# Patient Record
Sex: Male | Born: 1945
Health system: Southern US, Community
[De-identification: ages and names within clinical notes are randomized; demographics above are authoritative.]

## PROBLEM LIST (undated history)

## (undated) DIAGNOSIS — I10 Essential (primary) hypertension: Secondary | ICD-10-CM

## (undated) DIAGNOSIS — C801 Malignant (primary) neoplasm, unspecified: Secondary | ICD-10-CM

## (undated) DIAGNOSIS — E7800 Pure hypercholesterolemia, unspecified: Secondary | ICD-10-CM

## (undated) HISTORY — PX: PROSTATECTOMY: SHX69

## (undated) HISTORY — PX: OTHER SURGICAL HISTORY: SHX169

---

## 2017-10-16 DIAGNOSIS — I10 Essential (primary) hypertension: Secondary | ICD-10-CM | POA: Diagnosis not present

## 2017-10-16 DIAGNOSIS — R7301 Impaired fasting glucose: Secondary | ICD-10-CM | POA: Diagnosis not present

## 2017-10-16 DIAGNOSIS — R5381 Other malaise: Secondary | ICD-10-CM | POA: Diagnosis not present

## 2017-10-21 DIAGNOSIS — R7301 Impaired fasting glucose: Secondary | ICD-10-CM | POA: Diagnosis not present

## 2017-11-18 DIAGNOSIS — I10 Essential (primary) hypertension: Secondary | ICD-10-CM | POA: Diagnosis not present

## 2017-12-16 DIAGNOSIS — S0101XA Laceration without foreign body of scalp, initial encounter: Secondary | ICD-10-CM | POA: Diagnosis not present

## 2017-12-16 DIAGNOSIS — R030 Elevated blood-pressure reading, without diagnosis of hypertension: Secondary | ICD-10-CM | POA: Diagnosis not present

## 2017-12-16 DIAGNOSIS — S0003XD Contusion of scalp, subsequent encounter: Secondary | ICD-10-CM | POA: Diagnosis not present

## 2017-12-16 DIAGNOSIS — Z87891 Personal history of nicotine dependence: Secondary | ICD-10-CM | POA: Diagnosis not present

## 2017-12-16 DIAGNOSIS — W010XXA Fall on same level from slipping, tripping and stumbling without subsequent striking against object, initial encounter: Secondary | ICD-10-CM | POA: Diagnosis not present

## 2017-12-16 DIAGNOSIS — S0191XA Laceration without foreign body of unspecified part of head, initial encounter: Secondary | ICD-10-CM | POA: Diagnosis not present

## 2017-12-16 DIAGNOSIS — S0990XA Unspecified injury of head, initial encounter: Secondary | ICD-10-CM | POA: Diagnosis not present

## 2017-12-16 DIAGNOSIS — S01311A Laceration without foreign body of right ear, initial encounter: Secondary | ICD-10-CM | POA: Diagnosis not present

## 2017-12-16 DIAGNOSIS — S0181XA Laceration without foreign body of other part of head, initial encounter: Secondary | ICD-10-CM | POA: Diagnosis not present

## 2017-12-16 DIAGNOSIS — Z23 Encounter for immunization: Secondary | ICD-10-CM | POA: Diagnosis not present

## 2018-04-23 DIAGNOSIS — I10 Essential (primary) hypertension: Secondary | ICD-10-CM | POA: Diagnosis not present

## 2018-04-23 DIAGNOSIS — E782 Mixed hyperlipidemia: Secondary | ICD-10-CM | POA: Diagnosis not present

## 2018-04-23 DIAGNOSIS — E559 Vitamin D deficiency, unspecified: Secondary | ICD-10-CM | POA: Diagnosis not present

## 2018-04-23 DIAGNOSIS — R7301 Impaired fasting glucose: Secondary | ICD-10-CM | POA: Diagnosis not present

## 2018-04-28 DIAGNOSIS — E782 Mixed hyperlipidemia: Secondary | ICD-10-CM | POA: Diagnosis not present

## 2018-04-28 DIAGNOSIS — R7301 Impaired fasting glucose: Secondary | ICD-10-CM | POA: Diagnosis not present

## 2018-04-28 DIAGNOSIS — I10 Essential (primary) hypertension: Secondary | ICD-10-CM | POA: Diagnosis not present

## 2018-04-28 DIAGNOSIS — E559 Vitamin D deficiency, unspecified: Secondary | ICD-10-CM | POA: Diagnosis not present

## 2018-07-16 ENCOUNTER — Ambulatory Visit (INDEPENDENT_AMBULATORY_CARE_PROVIDER_SITE_OTHER): Payer: Self-pay

## 2018-07-16 DIAGNOSIS — Z1211 Encounter for screening for malignant neoplasm of colon: Secondary | ICD-10-CM

## 2018-07-16 MED ORDER — NA SULFATE-K SULFATE-MG SULF 17.5-3.13-1.6 GM/177ML PO SOLN: 1.0000 | ORAL | 0 refills | Status: AC

## 2018-07-16 NOTE — Patient Instructions (Signed)
Gregg Dalton  [birth year] MRN: [id number]     Procedure Date: 08/25/18 Time to register: 9:30am Place to register: [facility] Procedure Time: 10:30am Scheduled provider: R. Garfield Cornea, MD    PREPARATION FOR COLONOSCOPY WITH SUPREP BOWEL PREP KIT  Note: Suprep Bowel Prep Kit is a split-dose (2day) regimen. Consumption of BOTH 6-ounce bottles is required for a complete prep.  Please notify us immediately if you are diabetic, take iron supplements, or if you are on Coumadin or any other blood thinners.                                                                                                                                                    2 DAYS BEFORE PROCEDURE:  DATE: 08/23/18   DAY: Sunday Begin clear liquid diet AFTER your lunch meal. NO SOLID FOODS after this point.  1 DAY BEFORE PROCEDURE:  DATE: 08/24/18   DAY: Monday Continue clear liquids the entire day - NO SOLID FOOD.    At 6:00pm: Complete steps 1 through 4 below, using ONE (1) 6-ounce bottle, before going to bed. Step 1:  Pour ONE (1) 6-ounce bottle of SUPREP liquid into the mixing container.  Step 2:  Add cool drinking water to the 16 ounce line on the container and mix.  Note: Dilute the solution concentrate as directed prior to use. Step 3:  DRINK ALL the liquid in the container. Step 4:  You MUST drink an additional two (2) or more 16 ounce containers of water over the next one (1) hour.   Continue clear liquids.  DAY OF PROCEDURE:   DATE: 08/25/18   DAY: Tuesday If you take medications for your heart, blood pressure, or breathing, you may take these medications.    5 hours before your procedure at:5:30am Step 1:  Pour ONE (1) 6-ounce bottle of SUPREP liquid into the mixing container.  Step 2:  Add cool drinking water to the 16 ounce line on the container and mix.  Note: Dilute the solution concentrate as directed prior to use. Step 3:  DRINK ALL the liquid in the container. Step 4:  You  MUST drink an additional two (2) or more 16 ounce containers of water over the next one (1) hour. You MUST complete the final glass of water at least 3 hours before your colonoscopy.   Nothing by mouth past:7:30am  You may take your morning medications with sip of water unless we have instructed otherwise.    Please see below for Dietary Information.  CLEAR LIQUIDS INCLUDE:  Water Jello (NOT red in color)   Ice Popsicles (NOT red in color)   Tea (sugar ok, no milk/cream) Powdered fruit flavored drinks  Coffee (sugar ok, no milk/cream) Gatorade/ Lemonade/ Kool-Aid  (NOT red in color)   Juice: apple, white grape, white cranberry Soft  drinks  Clear bullion, consomme, broth (fat free beef/chicken/vegetable)  Carbonated beverages (any kind)  Strained chicken noodle soup Hard Candy   Remember: Clear liquids are liquids that will allow you to see your fingers on the other side of a clear glass. Be sure liquids are NOT red in color, and not cloudy, but CLEAR.  DO NOT EAT OR DRINK ANY OF THE FOLLOWING:  Dairy products of any kind   Cranberry juice Tomato juice / V8 juice   Grapefruit juice Orange juice     Red grape juice  Do not eat any solid foods, including such foods as: cereal, oatmeal, yogurt, fruits, vegetables, creamed soups, eggs, bread, crackers, pureed foods in a blender, etc.   HELPFUL HINTS FOR DRINKING PREP SOLUTION:   Make sure prep is extremely cold. Mix and refrigerate the the morning of the prep. You may also put in the freezer.   You may try mixing some Crystal Light or Country Time Lemonade if you prefer. Mix in small amounts; add more if necessary.  Try drinking through a straw  Rinse mouth with water or a mouthwash between glasses, to remove after-taste.  Try sipping on a cold beverage /ice/ popsicles between glasses of prep.  Place a piece of sugar-free hard candy in mouth between glasses.  If you become nauseated, try consuming smaller amounts, or stretch out  the time between glasses. Stop for 30-60 minutes, then slowly start back drinking.     OTHER INSTRUCTIONS  You will need a responsible adult at least 72 years of age to accompany you and drive you home. This person must remain in the waiting room during your procedure. The hospital will cancel your procedure if you do not have a responsible adult with you.   1. Wear loose fitting clothing that is easily removed. 2. Leave jewelry and other valuables at home.  3. Remove all body piercing jewelry and leave at home. 4. Total time from sign-in until discharge is approximately 2-3 hours. 5. You should go home directly after your procedure and rest. You can resume normal activities the day after your procedure. 6. The day of your procedure you should not:  Drive  Make legal decisions  Operate machinery  Drink alcohol  Return to work   You may call the office (Dept: [phone]) before 5:00pm, or page the doctor on call [phone]) after 5:00pm, for further instructions, if necessary.   Insurance Information YOU WILL NEED TO CHECK WITH YOUR INSURANCE COMPANY FOR THE BENEFITS OF COVERAGE YOU HAVE FOR THIS PROCEDURE.  UNFORTUNATELY, NOT ALL INSURANCE COMPANIES HAVE BENEFITS TO COVER ALL OR PART OF THESE TYPES OF PROCEDURES.  IT IS YOUR RESPONSIBILITY TO CHECK YOUR BENEFITS, HOWEVER, WE WILL BE GLAD TO ASSIST YOU WITH ANY CODES YOUR INSURANCE COMPANY MAY NEED.    PLEASE NOTE THAT MOST INSURANCE COMPANIES WILL NOT COVER A SCREENING COLONOSCOPY FOR PEOPLE UNDER THE AGE OF 50  IF YOU HAVE BCBS INSURANCE, YOU MAY HAVE BENEFITS FOR A SCREENING COLONOSCOPY BUT IF POLYPS ARE FOUND THE DIAGNOSIS WILL CHANGE AND THEN YOU MAY HAVE A DEDUCTIBLE THAT WILL NEED TO BE MET. SO PLEASE MAKE SURE YOU CHECK YOUR BENEFITS FOR A SCREENING COLONOSCOPY AS WELL AS A DIAGNOSTIC COLONOSCOPY.

## 2018-07-16 NOTE — Progress Notes (Signed)
Gastroenterology Pre-Procedure Review  Request Date:07/16/18 Requesting Physician: Inetta Fermo ( previous tcs in 2009 or 2010 at [place] in [place]. Pt reports no polyps. Manuela Schwartz to get copy of tcs)  PATIENT REVIEW QUESTIONS: The patient responded to the following health history questions as indicated:    1. Diabetes Melitis: no 2. Joint replacements in the past 12 months: no 3. Major health problems in the past 3 months: no 4. Has an artificial valve or MVP: no 5. Has a defibrillator: no 6. Has been advised in past to take antibiotics in advance of a procedure like teeth cleaning: no 7. Family history of colon cancer: no  8. Alcohol Use: yes (wine with dinner ) 9. History of sleep apnea: no  10. History of coronary artery or other vascular stents placed within the last 12 months: no 11. History of any prior anesthesia complications: no    MEDICATIONS & ALLERGIES:    Patient reports the following regarding taking any blood thinners:   Plavix? no Aspirin? yes (81mg ) Coumadin? no Brilinta? no Xarelto? no Eliquis? no Pradaxa? no Savaysa? no Effient? no  Patient confirms/reports the following medications:  Current Outpatient Medications  Medication Sig Dispense Refill  . aspirin EC 81 MG tablet Take 81 mg by mouth daily.    Marland Kitchen atorvastatin (LIPITOR) 10 MG tablet daily.    . cholecalciferol (VITAMIN D) 1000 units tablet Take 1,000 Units by mouth daily.    Marland Kitchen losartan (COZAAR) 100 MG tablet daily.     No current facility-administered medications for this visit.     Patient confirms/reports the following allergies:  No Known Allergies  No orders of the defined types were placed in this encounter.   AUTHORIZATION INFORMATION Primary Insurance:[employer] medicare ,  ID #: [id number] Pre-Cert / Josem Kaufmann required: no  SCHEDULE INFORMATION: Procedure has been scheduled as follows:  Date: 08/25/18, Time: 10:30  Location: [facility]  This Gastroenterology Pre-Precedure Review Form is  being routed to the following provider(s): Neil Crouch, PA

## 2018-07-22 NOTE — Progress Notes (Signed)
Ok to schedule.

## 2018-08-25 ENCOUNTER — Other Ambulatory Visit: Payer: Self-pay

## 2018-08-25 ENCOUNTER — Encounter (HOSPITAL_COMMUNITY)
Admission: RE | Disposition: A | Discharge status: Home / Self Care | Payer: Self-pay | Source: Ambulatory Visit | Attending: Internal Medicine

## 2018-08-25 ENCOUNTER — Encounter (HOSPITAL_COMMUNITY): Payer: Self-pay | Admitting: *Deleted

## 2018-08-25 ENCOUNTER — Ambulatory Visit (HOSPITAL_COMMUNITY)
Admission: RE | Admit: 2018-08-25 | Discharge: 2018-08-25 | Disposition: A | Discharge status: Home / Self Care | Payer: Medicare Other | Source: Ambulatory Visit | Attending: Internal Medicine | Admitting: Internal Medicine

## 2018-08-25 DIAGNOSIS — Z1211 Encounter for screening for malignant neoplasm of colon: Secondary | ICD-10-CM | POA: Insufficient documentation

## 2018-08-25 DIAGNOSIS — E7800 Pure hypercholesterolemia, unspecified: Secondary | ICD-10-CM | POA: Insufficient documentation

## 2018-08-25 DIAGNOSIS — I10 Essential (primary) hypertension: Secondary | ICD-10-CM | POA: Insufficient documentation

## 2018-08-25 DIAGNOSIS — K5730 Diverticulosis of large intestine without perforation or abscess without bleeding: Secondary | ICD-10-CM | POA: Insufficient documentation

## 2018-08-25 DIAGNOSIS — Z7982 Long term (current) use of aspirin: Secondary | ICD-10-CM | POA: Diagnosis not present

## 2018-08-25 HISTORY — DX: Malignant (primary) neoplasm, unspecified: C80.1

## 2018-08-25 HISTORY — PX: COLONOSCOPY: SHX5424

## 2018-08-25 HISTORY — DX: Essential (primary) hypertension: I10

## 2018-08-25 HISTORY — DX: Pure hypercholesterolemia, unspecified: E78.00

## 2018-08-25 SURGERY — COLONOSCOPY
Anesthesia: Moderate Sedation

## 2018-08-25 MED ORDER — ONDANSETRON HCL 4 MG/2ML IJ SOLN
INTRAMUSCULAR | Status: AC
  Filled 2018-08-25: qty 2

## 2018-08-25 MED ORDER — MIDAZOLAM HCL 5 MG/5ML IJ SOLN
INTRAMUSCULAR | Status: AC
  Filled 2018-08-25: qty 5

## 2018-08-25 MED ORDER — MEPERIDINE HCL 100 MG/ML IJ SOLN
INTRAMUSCULAR | Status: DC | PRN
  Administered 2018-08-25: 25 mg
  Administered 2018-08-25: 15 mg

## 2018-08-25 MED ORDER — SODIUM CHLORIDE 0.9 % IV SOLN
INTRAVENOUS | Status: DC
  Administered 2018-08-25: 10:00:00 via INTRAVENOUS

## 2018-08-25 MED ORDER — ONDANSETRON HCL 4 MG/2ML IJ SOLN
INTRAMUSCULAR | Status: DC | PRN
  Administered 2018-08-25: 4 mg via INTRAVENOUS

## 2018-08-25 MED ORDER — MEPERIDINE HCL 50 MG/ML IJ SOLN
INTRAMUSCULAR | Status: AC
  Filled 2018-08-25: qty 1

## 2018-08-25 MED ORDER — MIDAZOLAM HCL 5 MG/5ML IJ SOLN
INTRAMUSCULAR | Status: DC | PRN
  Administered 2018-08-25: 1 mg via INTRAVENOUS
  Administered 2018-08-25 (×2): 2 mg via INTRAVENOUS
  Administered 2018-08-25: 1 mg via INTRAVENOUS

## 2018-08-25 MED ORDER — STERILE WATER FOR IRRIGATION IR SOLN
Status: DC | PRN
  Administered 2018-08-25: 10:00:00

## 2018-08-25 NOTE — Discharge Instructions (Signed)
Colonoscopy Discharge Instructions  Read the instructions outlined below and refer to this sheet in the next few weeks. These discharge instructions provide you with general information on caring for yourself after you leave the hospital. Your doctor may also give you specific instructions. While your treatment has been planned according to the most current medical practices available, unavoidable complications occasionally occur. If you have any problems or questions after discharge, call Dr. Gala Romney at [phone]. ACTIVITY  You may resume your regular activity, but move at a slower pace for the next 24 hours.   Take frequent rest periods for the next 24 hours.   Walking will help get rid of the air and reduce the bloated feeling in your belly (abdomen).   No driving for 24 hours (because of the medicine (anesthesia) used during the test).    Do not sign any important legal documents or operate any machinery for 24 hours (because of the anesthesia used during the test).  NUTRITION  Drink plenty of fluids.   You may resume your normal diet as instructed by your doctor.   Begin with a light meal and progress to your normal diet. Heavy or fried foods are harder to digest and may make you feel sick to your stomach (nauseated).   Avoid alcoholic beverages for 24 hours or as instructed.  MEDICATIONS  You may resume your normal medications unless your doctor tells you otherwise.  WHAT YOU CAN EXPECT TODAY  Some feelings of bloating in the abdomen.   Passage of more gas than usual.   Spotting of blood in your stool or on the toilet paper.  IF YOU HAD POLYPS REMOVED DURING THE COLONOSCOPY:  No aspirin products for 7 days or as instructed.   No alcohol for 7 days or as instructed.   Eat a soft diet for the next 24 hours.  FINDING OUT THE RESULTS OF YOUR TEST Not all test results are available during your visit. If your test results are not back during the visit, make an appointment  with your caregiver to find out the results. Do not assume everything is normal if you have not heard from your caregiver or the medical facility. It is important for you to follow up on all of your test results.  SEEK IMMEDIATE MEDICAL ATTENTION IF:  You have more than a spotting of blood in your stool.   Your belly is swollen (abdominal distention).   You are nauseated or vomiting.   You have a temperature over 101.   You have abdominal pain or discomfort that is severe or gets worse throughout the day.    Colon diverticulosis information provided  I do not recommend a future colonoscopy unless new symptoms develop    Diverticulosis Diverticulosis is a condition that develops when small pouches (diverticula) form in the wall of the large intestine (colon). The colon is where water is absorbed and stool is formed. The pouches form when the inside layer of the colon pushes through weak spots in the outer layers of the colon. You may have a few pouches or many of them. What are the causes? The cause of this condition is not known. What increases the risk? The following factors may make you more likely to develop this condition:  Being older than age 66. Your risk for this condition increases with age. Diverticulosis is rare among people younger than age 8. By age 61, many people have it.  Eating a low-fiber diet.  Having frequent constipation.  Being  overweight.  Not getting enough exercise.  Smoking.  Taking over-the-counter pain medicines, like aspirin and ibuprofen.  Having a family history of diverticulosis.  What are the signs or symptoms? In most people, there are no symptoms of this condition. If you do have symptoms, they may include:  Bloating.  Cramps in the abdomen.  Constipation or diarrhea.  Pain in the lower left side of the abdomen.  How is this diagnosed? This condition is most often diagnosed during an exam for other colon problems. Because  diverticulosis usually has no symptoms, it often cannot be diagnosed independently. This condition may be diagnosed by:  Using a flexible scope to examine the colon (colonoscopy).  Taking an X-ray of the colon after dye has been put into the colon (barium enema).  Doing a CT scan.  How is this treated? You may not need treatment for this condition if you have never developed an infection related to diverticulosis. If you have had an infection before, treatment may include:  Eating a high-fiber diet. This may include eating more fruits, vegetables, and grains.  Taking a fiber supplement.  Taking a live bacteria supplement (probiotic).  Taking medicine to relax your colon.  Taking antibiotic medicines.  Follow these instructions at home:  Drink 6-8 glasses of water or more each day to prevent constipation.  Try not to strain when you have a bowel movement.  If you have had an infection before: ? Eat more fiber as directed by your health care provider or your diet and nutrition specialist (dietitian). ? Take a fiber supplement or probiotic, if your health care provider approves.  Take over-the-counter and prescription medicines only as told by your health care provider.  If you were prescribed an antibiotic, take it as told by your health care provider. Do not stop taking the antibiotic even if you start to feel better.  Keep all follow-up visits as told by your health care provider. This is important. Contact a health care provider if:  You have pain in your abdomen.  You have bloating.  You have cramps.  You have not had a bowel movement in 3 days. Get help right away if:  Your pain gets worse.  Your bloating becomes very bad.  You have a fever or chills, and your symptoms suddenly get worse.  You vomit.  You have bowel movements that are bloody or black.  You have bleeding from your rectum. Summary  Diverticulosis is a condition that develops when small  pouches (diverticula) form in the wall of the large intestine (colon).  You may have a few pouches or many of them.  This condition is most often diagnosed during an exam for other colon problems.  If you have had an infection related to diverticulosis, treatment may include increasing the fiber in your diet, taking supplements, or taking medicines. This information is not intended to replace advice given to you by your health care provider. Make sure you discuss any questions you have with your health care provider. Document Released: 09/05/2004 Document Revised: 10/28/2016 Document Reviewed: 10/28/2016 Elsevier Interactive Patient Education  2017 [employer].

## 2018-08-25 NOTE — H&P (Signed)
$'@LOGO'P$ @   Primary Care Physician:  Celene Squibb, MD Primary Gastroenterologist:  Dr. Gala Romney  Pre-Procedure History & Physical: HPI:  Gregg Dalton is a 72 y.o. male is here for a screening colonoscopy. Negative colonoscopy in [place] 10 years ago. No bowel symptoms. No family history of colon cancer.  Past Medical History:  Diagnosis Date  . Cancer [facility])    prostate cancer  . Hypercholesteremia   . Hypertension     Prior to Admission medications   Medication Sig Start Date End Date Taking? Authorizing Provider  aspirin EC 81 MG tablet Take 81 mg by mouth daily.   Yes [provider]  atorvastatin (LIPITOR) 10 MG tablet Take 10 mg by mouth daily.  06/23/18  Yes [provider]  cholecalciferol (VITAMIN D) 1000 units tablet Take 1,000 Units by mouth daily.   Yes [provider]  losartan (COZAAR) 100 MG tablet Take 100 mg by mouth daily.  06/23/18  Yes [provider]  Na Sulfate-K Sulfate-Mg Sulf (SUPREP BOWEL PREP KIT) 17.5-3.13-1.6 GM/177ML SOLN Take 1 kit by mouth as directed. 07/16/18  Yes Mahala Menghini, PA-C    Allergies as of 07/16/2018  . (No Known Allergies)    Family History  Problem Relation Age of Onset  . Colon cancer Neg Hx     Social History   Socioeconomic History  . Marital status: Married    Spouse name: Not on file  . Number of children: Not on file  . Years of education: Not on file  . Highest education level: Not on file  Occupational History  . Not on file  Social Needs  . Financial resource strain: Not on file  . Food insecurity:    Worry: Not on file    Inability: Not on file  . Transportation needs:    Medical: Not on file    Non-medical: Not on file  Tobacco Use  . Smoking status: Never Smoker  . Smokeless tobacco: Never Used  Substance and Sexual Activity  . Alcohol use: Yes    Alcohol/week: 28.0 standard drinks    Types: 28 Glasses of wine per week  . Drug use: Never  . Sexual activity: Not on  file  Lifestyle  . Physical activity:    Days per week: Not on file    Minutes per session: Not on file  . Stress: Not on file  Relationships  . Social connections:    Talks on phone: Not on file    Gets together: Not on file    Attends religious service: Not on file    Active member of club or organization: Not on file    Attends meetings of clubs or organizations: Not on file    Relationship status: Not on file  . Intimate partner violence:    Fear of current or ex partner: Not on file    Emotionally abused: Not on file    Physically abused: Not on file    Forced sexual activity: Not on file  Other Topics Concern  . Not on file  Social History Narrative  . Not on file    Review of Systems: See HPI, otherwise negative ROS  Physical Exam: BP 134/83   Pulse 86   Temp 97.9 F (36.6 C) (Oral)   Resp 17   Ht '5\' 11"'$  (1.803 m)   Wt 88.9 kg   SpO2 97%   BMI 27.34 kg/m  General:   Alert,  Well-developed, well-nourished, pleasant and  cooperative in NAD Neck:  Supple; no masses or thyromegaly. Lungs:  Clear throughout to auscultation.   No wheezes, crackles, or rhonchi. No acute distress. Heart:  Regular rate and rhythm; no murmurs, clicks, rubs,  or gallops. Abdomen:  Soft, nontender and nondistended. No masses, hepatosplenomegaly or hernias noted. Normal bowel sounds, without guarding, and without rebound.    Impression/Plan: Gregg Dalton is now here to undergo a screening colonoscopy.  Average risk screening examination.  Risks, benefits, limitations, imponderables and alternatives regarding colonoscopy have been reviewed with the patient. Questions have been answered. All parties agreeable.     Notice:  This dictation was prepared with Dragon dictation along with smaller phrase technology. Any transcriptional errors that result from this process are unintentional and may not be corrected upon review.

## 2018-08-25 NOTE — Op Note (Addendum)
[facility] Patient Name: Gregg Dalton Procedure Date: 08/25/2018 9:38 AM MRN: [id number] Date of Birth: [birth year] Attending MD: Norvel Richards , MD CSN: [id number] Age: 72 Admit Type: Outpatient Procedure:                Colonoscopy Indications:              Screening for colorectal malignant neoplasm Providers:                Norvel Richards, MD, Janeece Riggers, RN, Aram Candela Referring MD:              Medicines:                Midazolam mg IV Complications:            No immediate complications. Estimated Blood Loss:     Estimated blood loss: none. Procedure:                Pre-Anesthesia Assessment:                           - Prior to the procedure, a History and Physical                            was performed, and patient medications and                            allergies were reviewed. The patient's tolerance of                            previous anesthesia was also reviewed. The risks                            and benefits of the procedure and the sedation                            options and risks were discussed with the patient.                            All questions were answered, and informed consent                            was obtained. Prior Anticoagulants: The patient has                            taken no previous anticoagulant or antiplatelet                            agents. ASA Grade Assessment: II - A patient with                            mild systemic disease. After reviewing the risks  and benefits, the patient was deemed in                            satisfactory condition to undergo the procedure.                           After obtaining informed consent, the colonoscope                            was passed under direct vision. Throughout the                            procedure, the patient's blood pressure, pulse, and                            oxygen saturations were  monitored continuously. The                            CF-[id number] ([id number]) scope was introduced through                            the anus and advanced to the the cecum, identified                            by appendiceal orifice and ileocecal valve. The                            colonoscopy was performed without difficulty. The                            patient tolerated the procedure well. The quality                            of the bowel preparation was adequate. The                            ileocecal valve, appendiceal orifice, and rectum                            were photographed. The entire colon was well                            visualized. Scope In: 10:01:20 AM Scope Out: 10:22:15 AM Scope Withdrawal Time: 0 hours 9 minutes 16 seconds  Total Procedure Duration: 0 hours 20 minutes 55 seconds  Findings:      The perianal and digital rectal examinations were normal.      Multiple small and large-mouthed diverticula were found in the sigmoid       colon and descending colon.      The entire examined colon appeared normal on direct and retroflexion       views. Impression:               - Diverticulosis in the sigmoid colon and in the  descending colon.                           - The entire examined colon is normal on direct and                            retroflexion views.                           - No specimens collected. Moderate Sedation:      Moderate (conscious) sedation was administered by the endoscopy nurse       and supervised by the endoscopist. The following parameters were       monitored: oxygen saturation, heart rate, blood pressure, respiratory       rate, EKG, adequacy of pulmonary ventilation, and response to care.       Total physician intraservice time was 27 minutes. Recommendation:           - Patient has a contact number available for                            emergencies. The signs and symptoms of potential                             delayed complications were discussed with the                            patient. Return to normal activities tomorrow.                            Written discharge instructions were provided to the                            patient.                           - Resume previous diet.                           - Continue present medications.                           - No future colonoscopy unless new symptoms develop.                           - Return to GI office PRN. Procedure Code(s):        --- Professional ---                           [id number], Colonoscopy, flexible; diagnostic, including                            collection of specimen(s) by brushing or washing,                            when performed (separate procedure)  G0500, Moderate sedation services provided by the                            same physician or other qualified health care                            professional performing a gastrointestinal                            endoscopic service that sedation supports,                            requiring the presence of an independent trained                            observer to assist in the monitoring of the                            patient's level of consciousness and physiological                            status; initial 15 minutes of intra-service time;                            patient age 34 years or older (additional time may                            be reported with [phone], as appropriate)                           [phone], Moderate sedation services provided by the                            same physician or other qualified health care                            professional performing the diagnostic or                            therapeutic service that the sedation supports,                            requiring the presence of an independent trained                            observer to assist in the  monitoring of the                            patient's level of consciousness and physiological                            status; each additional 15 minutes intraservice  time (List separately in addition to code for                            primary service) Diagnosis Code(s):        --- Professional ---                           Z12.11, Encounter for screening for malignant                            neoplasm of colon                           K57.30, Diverticulosis of large intestine without                            perforation or abscess without bleeding CPT copyright 2018 American Medical Association. All rights reserved. The codes documented in this report are preliminary and upon coder review may  be revised to meet current compliance requirements. Cristopher Estimable. Praise Dolecki, MD Norvel Richards, MD 08/25/2018 10:48:39 AM This report has been signed electronically. Number of Addenda: 0

## 2018-08-27 ENCOUNTER — Encounter (HOSPITAL_COMMUNITY): Payer: Self-pay | Admitting: Internal Medicine

## 2018-09-25 ENCOUNTER — Other Ambulatory Visit: Payer: Self-pay | Admitting: Pharmacist

## 2018-09-25 NOTE — Patient Outreach (Signed)
[place] [facility]) Care Management  09/25/2018  Gregg Dalton [birth year] [id number]   Incoming call from Synetta Shadow in response to the [facility] Medication Adherence Campaign. Speak with patient. HIPAA identifiers verified and verbal consent received.  Gregg Dalton reports that he takes his losartan 100 mg and atorvastatin 10 mg each once daily as directed. Denies any missed doses or barriers to adherence. Counsel patient on the importance of adherence to these medications. Gregg Dalton reports that he has just ordered refills of each of these medications through [facility] mail order.  Patient denies any further medication questions/concerns at this time.   Will close pharmacy episode.  Harlow Asa, PharmD, [facility] [phone]

## 2018-11-09 DIAGNOSIS — Z23 Encounter for immunization: Secondary | ICD-10-CM | POA: Diagnosis not present

## 2018-11-10 DIAGNOSIS — E782 Mixed hyperlipidemia: Secondary | ICD-10-CM | POA: Diagnosis not present

## 2018-11-10 DIAGNOSIS — E559 Vitamin D deficiency, unspecified: Secondary | ICD-10-CM | POA: Diagnosis not present

## 2018-11-10 DIAGNOSIS — I10 Essential (primary) hypertension: Secondary | ICD-10-CM | POA: Diagnosis not present

## 2018-11-10 DIAGNOSIS — R7301 Impaired fasting glucose: Secondary | ICD-10-CM | POA: Diagnosis not present

## 2018-11-11 ENCOUNTER — Other Ambulatory Visit (HOSPITAL_COMMUNITY): Payer: Self-pay | Admitting: Internal Medicine

## 2018-11-11 DIAGNOSIS — I10 Essential (primary) hypertension: Secondary | ICD-10-CM | POA: Diagnosis not present

## 2018-11-11 DIAGNOSIS — E782 Mixed hyperlipidemia: Secondary | ICD-10-CM | POA: Diagnosis not present

## 2018-11-11 DIAGNOSIS — Z87898 Personal history of other specified conditions: Secondary | ICD-10-CM

## 2018-11-11 DIAGNOSIS — Z0000 Encounter for general adult medical examination without abnormal findings: Secondary | ICD-10-CM | POA: Diagnosis not present

## 2018-11-11 DIAGNOSIS — R748 Abnormal levels of other serum enzymes: Secondary | ICD-10-CM

## 2018-11-11 DIAGNOSIS — R7301 Impaired fasting glucose: Secondary | ICD-10-CM | POA: Diagnosis not present

## 2018-11-18 ENCOUNTER — Encounter (HOSPITAL_COMMUNITY): Payer: Self-pay

## 2018-11-18 ENCOUNTER — Ambulatory Visit (HOSPITAL_COMMUNITY): Payer: Medicare Other

## 2018-11-24 ENCOUNTER — Ambulatory Visit (HOSPITAL_COMMUNITY)
Admission: RE | Admit: 2018-11-24 | Discharge: 2018-11-24 | Disposition: A | Discharge status: Home / Self Care | Payer: Medicare Other | Source: Ambulatory Visit | Attending: Internal Medicine | Admitting: Internal Medicine

## 2018-11-24 DIAGNOSIS — K7689 Other specified diseases of liver: Secondary | ICD-10-CM | POA: Diagnosis not present

## 2018-11-24 DIAGNOSIS — R945 Abnormal results of liver function studies: Secondary | ICD-10-CM | POA: Insufficient documentation

## 2018-11-24 DIAGNOSIS — R748 Abnormal levels of other serum enzymes: Secondary | ICD-10-CM

## 2018-11-24 DIAGNOSIS — Z87898 Personal history of other specified conditions: Secondary | ICD-10-CM

## 2019-01-28 DIAGNOSIS — Z1283 Encounter for screening for malignant neoplasm of skin: Secondary | ICD-10-CM | POA: Diagnosis not present

## 2019-01-28 DIAGNOSIS — L821 Other seborrheic keratosis: Secondary | ICD-10-CM | POA: Diagnosis not present

## 2019-01-28 DIAGNOSIS — L308 Other specified dermatitis: Secondary | ICD-10-CM | POA: Diagnosis not present

## 2019-05-03 DIAGNOSIS — Z0000 Encounter for general adult medical examination without abnormal findings: Secondary | ICD-10-CM | POA: Diagnosis not present

## 2019-05-13 DIAGNOSIS — E559 Vitamin D deficiency, unspecified: Secondary | ICD-10-CM | POA: Diagnosis not present

## 2019-05-13 DIAGNOSIS — I10 Essential (primary) hypertension: Secondary | ICD-10-CM | POA: Diagnosis not present

## 2019-05-13 DIAGNOSIS — R7301 Impaired fasting glucose: Secondary | ICD-10-CM | POA: Diagnosis not present

## 2019-05-13 DIAGNOSIS — E782 Mixed hyperlipidemia: Secondary | ICD-10-CM | POA: Diagnosis not present

## 2019-05-19 DIAGNOSIS — I10 Essential (primary) hypertension: Secondary | ICD-10-CM | POA: Diagnosis not present

## 2019-05-19 DIAGNOSIS — E559 Vitamin D deficiency, unspecified: Secondary | ICD-10-CM | POA: Diagnosis not present

## 2019-05-19 DIAGNOSIS — E782 Mixed hyperlipidemia: Secondary | ICD-10-CM | POA: Diagnosis not present

## 2019-05-19 DIAGNOSIS — R7301 Impaired fasting glucose: Secondary | ICD-10-CM | POA: Diagnosis not present

## 2019-06-30 IMAGING — US ULTRASOUND ABDOMEN LIMITED
1 series · 14 of 25 positions shown · non-contrast
Comparison: None.

CLINICAL DATA: Initial evaluation for elevated liver enzymes.

EXAM:
ULTRASOUND ABDOMEN LIMITED RIGHT UPPER QUADRANT

[Series 1: ultrasound abdomen limited · 0.24mm/px · 14 of 53 slices shown]
[im 1/53]
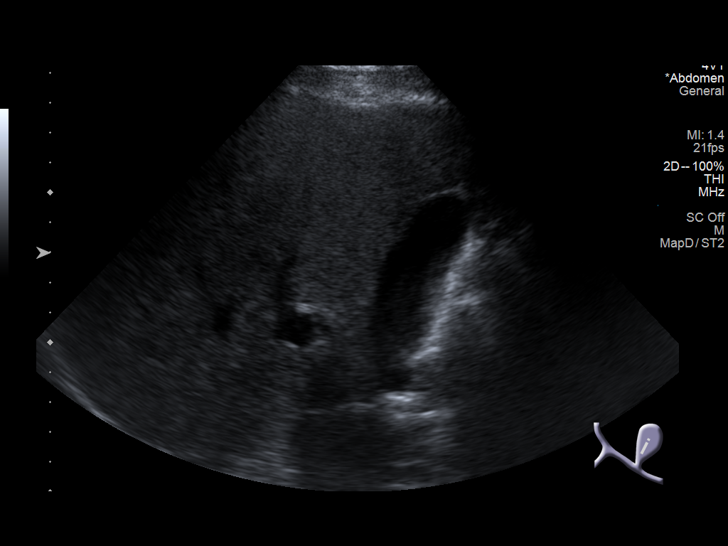
[im 5/53]
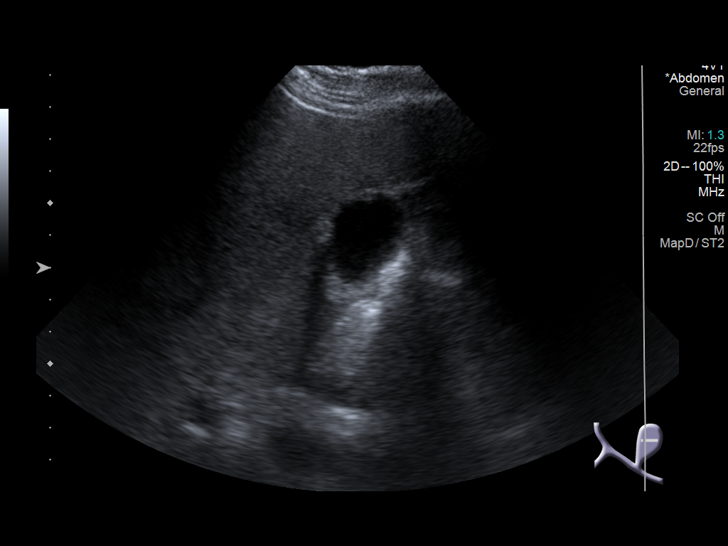
[im 9/53]
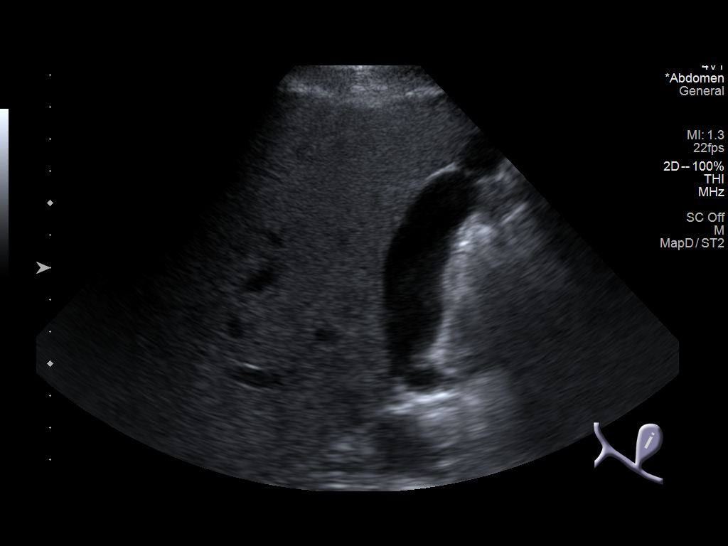
[im 14/53]
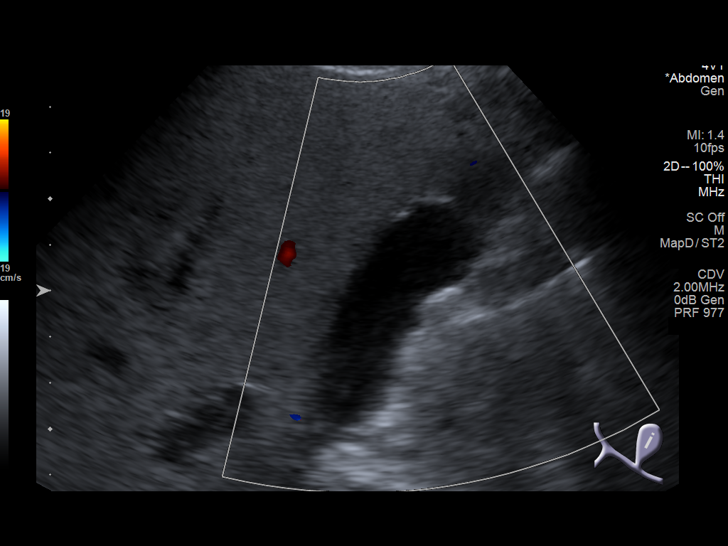
[im 18/53]
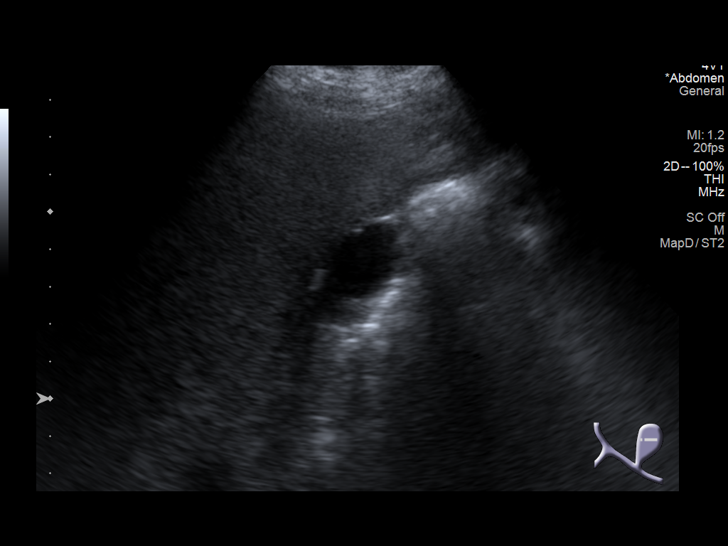
[im 20/53]
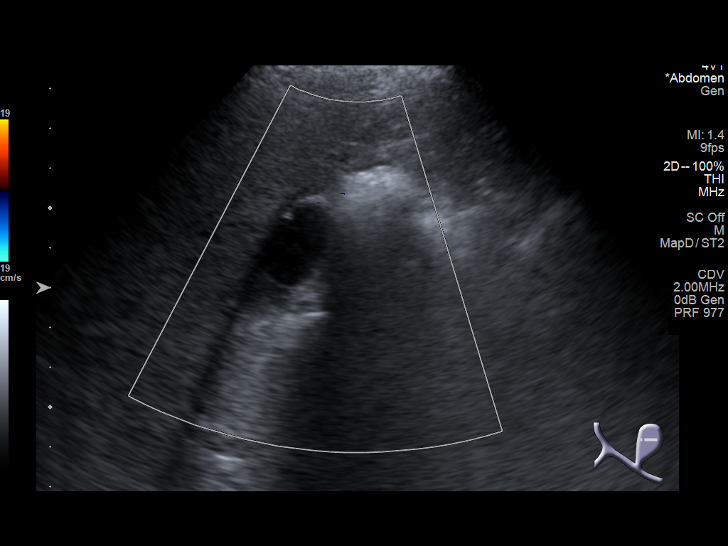
[im 24/53]
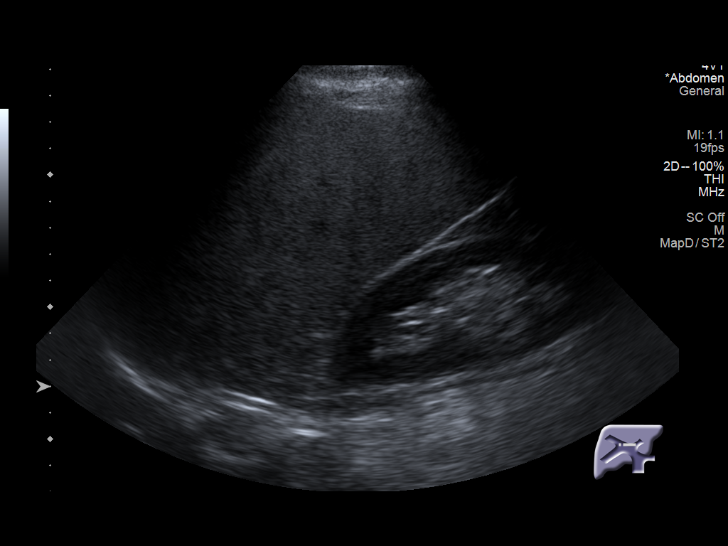
[im 29/53]
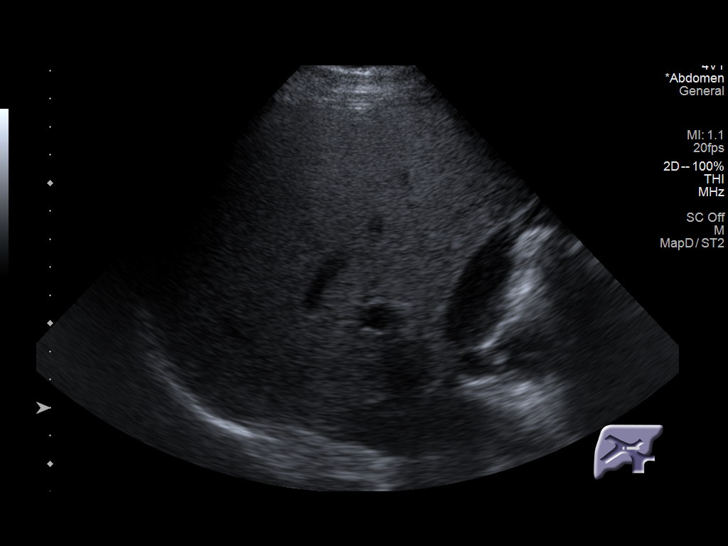
[im 33/53]
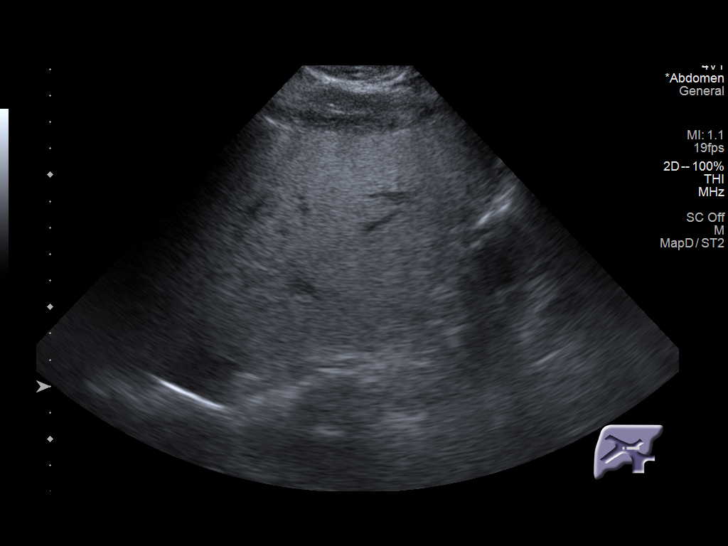
[im 35/53]
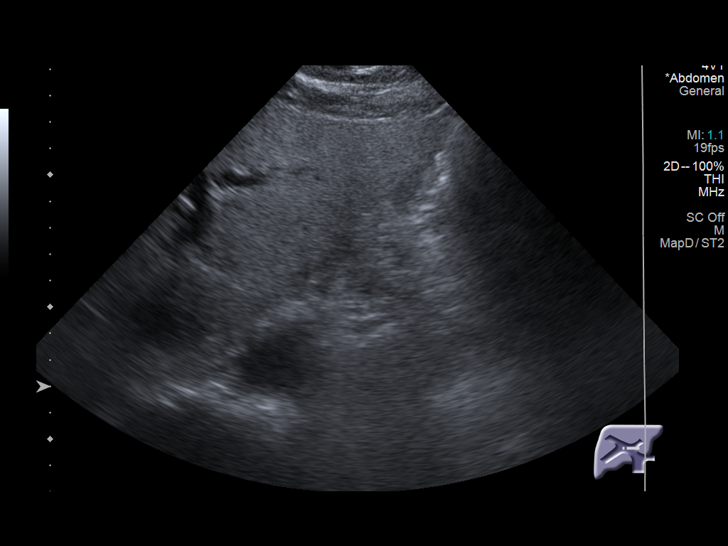
[im 40/53]
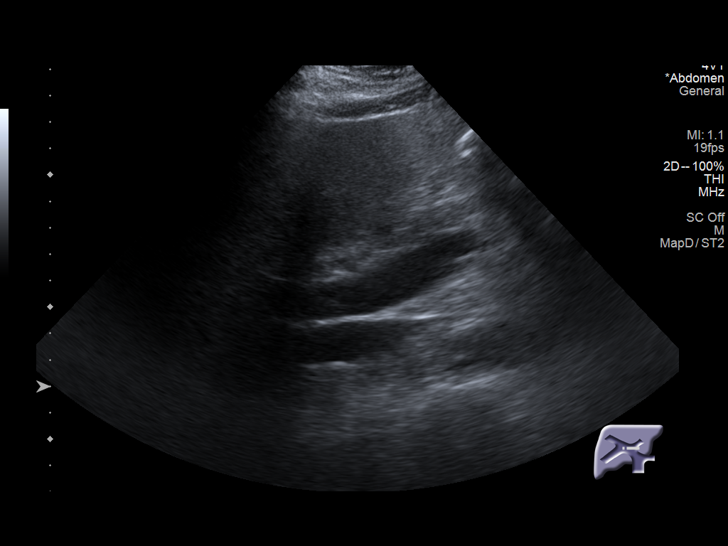
[im 44/53]
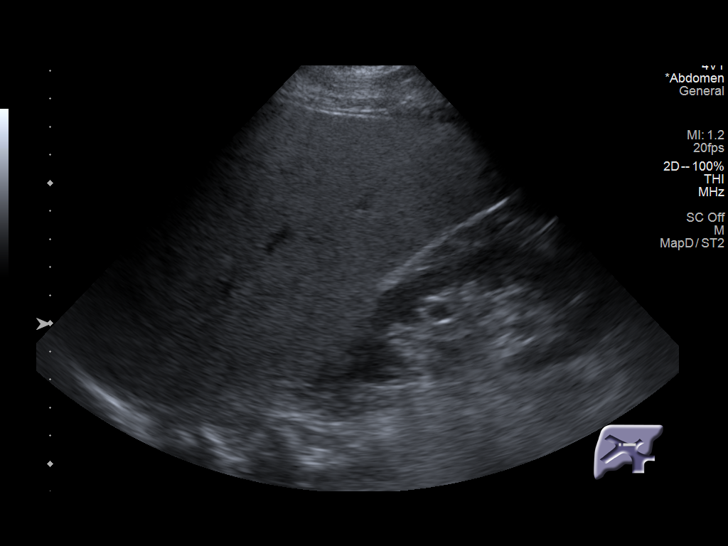
[im 48/53]
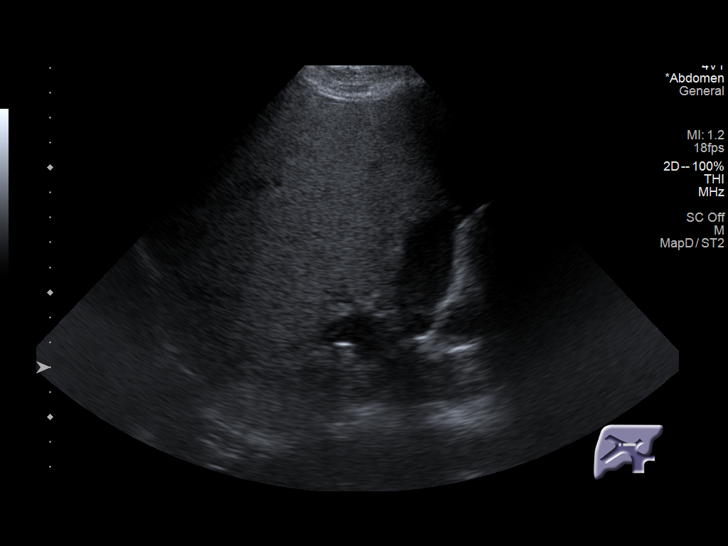
[im 53/53]
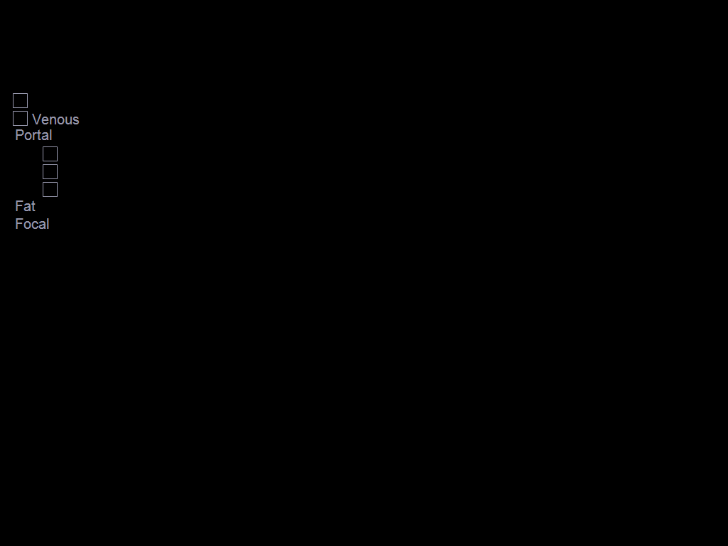

[14 of 25 positions shown; findings below may reference images not displayed]

FINDINGS: Gallbladder:

No gallstones or wall thickening visualized. 2.8 mm echogenic focus
adherent to the gallbladder wall suspected to reflect a small polyp.
No free pericholecystic fluid. No sonographic Murphy sign elicited
on exam.

Common bile duct:

Diameter: 4.6 mm

Liver:

No focal lesion identified. Diffusely increased echogenicity within
the liver, suggesting steatosis. Portal vein is patent on color
Doppler imaging with normal direction of blood flow towards the
liver.
IMPRESSION: 1. Diffusely increased echogenicity within the liver, suggesting
steatosis.
2. 2.8 mm echogenic focus along the gallbladder wall, suspected to
reflect a small polyp. This is almost certainly benign, with no
follow-up imaging recommended regarding this lesion.
3. Otherwise normal sonographic appearance of the gallbladder. No
biliary dilatation.

## 2019-09-03 ENCOUNTER — Other Ambulatory Visit: Payer: Self-pay

## 2019-09-03 NOTE — Patient Outreach (Signed)
[name] [facility]) Care Management  09/03/2019  Gregg Dalton 13-Jul-1946 [id number]   Medication Adherence call to Gregg Dalton HIPPA Compliant Voice message left with a call back number. Gregg Dalton is showing past due on Losartan 10 mg under [name].   [facility] Direct Dial [phone]  Fax [phone] [email or web address]

## 2019-09-23 ENCOUNTER — Other Ambulatory Visit: Payer: Self-pay

## 2019-09-23 NOTE — Patient Outreach (Signed)
[place] [facility]) Care Management  09/23/2019  KASHTON DEHLINGER 03/06/1946 [id number]   Medication Adherence call to Mr. Seng [place] HIPPA Compliant Voice message left with a call back number. Mr. [place] is showing past due on Atorvastatin 10 mg and Losartan 100 mg under [place].   [facility] Direct Dial [phone]  Fax [phone] [email or web address]

## 2019-10-26 DIAGNOSIS — Z23 Encounter for immunization: Secondary | ICD-10-CM | POA: Diagnosis not present

## 2019-10-26 DIAGNOSIS — S91342A Puncture wound with foreign body, left foot, initial encounter: Secondary | ICD-10-CM | POA: Diagnosis not present

## 2019-11-05 DIAGNOSIS — E782 Mixed hyperlipidemia: Secondary | ICD-10-CM | POA: Diagnosis not present

## 2019-11-05 DIAGNOSIS — I10 Essential (primary) hypertension: Secondary | ICD-10-CM | POA: Diagnosis not present

## 2019-11-05 DIAGNOSIS — R7301 Impaired fasting glucose: Secondary | ICD-10-CM | POA: Diagnosis not present

## 2019-11-05 DIAGNOSIS — E559 Vitamin D deficiency, unspecified: Secondary | ICD-10-CM | POA: Diagnosis not present

## 2019-11-10 DIAGNOSIS — E559 Vitamin D deficiency, unspecified: Secondary | ICD-10-CM | POA: Diagnosis not present

## 2019-11-10 DIAGNOSIS — R7301 Impaired fasting glucose: Secondary | ICD-10-CM | POA: Diagnosis not present

## 2019-11-10 DIAGNOSIS — I10 Essential (primary) hypertension: Secondary | ICD-10-CM | POA: Diagnosis not present

## 2019-11-10 DIAGNOSIS — E782 Mixed hyperlipidemia: Secondary | ICD-10-CM | POA: Diagnosis not present

## 2019-12-07 DIAGNOSIS — H25013 Cortical age-related cataract, bilateral: Secondary | ICD-10-CM | POA: Diagnosis not present

## 2019-12-07 DIAGNOSIS — H2513 Age-related nuclear cataract, bilateral: Secondary | ICD-10-CM | POA: Diagnosis not present

## 2019-12-07 DIAGNOSIS — H25043 Posterior subcapsular polar age-related cataract, bilateral: Secondary | ICD-10-CM | POA: Diagnosis not present

## 2019-12-07 DIAGNOSIS — H2511 Age-related nuclear cataract, right eye: Secondary | ICD-10-CM | POA: Diagnosis not present

## 2019-12-07 DIAGNOSIS — H18413 Arcus senilis, bilateral: Secondary | ICD-10-CM | POA: Diagnosis not present

## 2020-02-21 DIAGNOSIS — H2512 Age-related nuclear cataract, left eye: Secondary | ICD-10-CM | POA: Diagnosis not present

## 2020-02-21 DIAGNOSIS — H52202 Unspecified astigmatism, left eye: Secondary | ICD-10-CM | POA: Diagnosis not present

## 2020-02-22 DIAGNOSIS — H2511 Age-related nuclear cataract, right eye: Secondary | ICD-10-CM | POA: Diagnosis not present

## 2020-03-06 DIAGNOSIS — H2511 Age-related nuclear cataract, right eye: Secondary | ICD-10-CM | POA: Diagnosis not present

## 2020-05-08 DIAGNOSIS — R7301 Impaired fasting glucose: Secondary | ICD-10-CM | POA: Diagnosis not present

## 2020-05-08 DIAGNOSIS — E782 Mixed hyperlipidemia: Secondary | ICD-10-CM | POA: Diagnosis not present

## 2020-05-08 DIAGNOSIS — E559 Vitamin D deficiency, unspecified: Secondary | ICD-10-CM | POA: Diagnosis not present

## 2020-05-11 DIAGNOSIS — E782 Mixed hyperlipidemia: Secondary | ICD-10-CM | POA: Diagnosis not present

## 2020-05-11 DIAGNOSIS — R7301 Impaired fasting glucose: Secondary | ICD-10-CM | POA: Diagnosis not present

## 2020-05-11 DIAGNOSIS — I10 Essential (primary) hypertension: Secondary | ICD-10-CM | POA: Diagnosis not present

## 2020-05-11 DIAGNOSIS — E559 Vitamin D deficiency, unspecified: Secondary | ICD-10-CM | POA: Diagnosis not present

## 2020-05-11 DIAGNOSIS — Z0001 Encounter for general adult medical examination with abnormal findings: Secondary | ICD-10-CM | POA: Diagnosis not present

## 2020-05-16 ENCOUNTER — Other Ambulatory Visit (HOSPITAL_COMMUNITY): Payer: Self-pay | Admitting: Internal Medicine

## 2020-05-16 ENCOUNTER — Other Ambulatory Visit: Payer: Self-pay | Admitting: Internal Medicine

## 2020-05-16 DIAGNOSIS — Z8249 Family history of ischemic heart disease and other diseases of the circulatory system: Secondary | ICD-10-CM

## 2020-05-16 DIAGNOSIS — R109 Unspecified abdominal pain: Secondary | ICD-10-CM

## 2020-05-16 DIAGNOSIS — I739 Peripheral vascular disease, unspecified: Secondary | ICD-10-CM

## 2020-05-16 DIAGNOSIS — Z87891 Personal history of nicotine dependence: Secondary | ICD-10-CM

## 2020-05-29 ENCOUNTER — Ambulatory Visit (HOSPITAL_COMMUNITY)
Admission: RE | Admit: 2020-05-29 | Discharge: 2020-05-29 | Disposition: A | Discharge status: Home / Self Care | Payer: Medicare Other | Source: Ambulatory Visit | Attending: Internal Medicine | Admitting: Internal Medicine

## 2020-05-29 ENCOUNTER — Ambulatory Visit (HOSPITAL_COMMUNITY): Payer: Medicare Other

## 2020-05-29 ENCOUNTER — Other Ambulatory Visit: Payer: Self-pay

## 2020-05-29 DIAGNOSIS — I739 Peripheral vascular disease, unspecified: Secondary | ICD-10-CM

## 2020-05-29 DIAGNOSIS — R109 Unspecified abdominal pain: Secondary | ICD-10-CM | POA: Insufficient documentation

## 2020-05-29 DIAGNOSIS — K760 Fatty (change of) liver, not elsewhere classified: Secondary | ICD-10-CM | POA: Diagnosis not present

## 2020-08-31 DIAGNOSIS — B078 Other viral warts: Secondary | ICD-10-CM | POA: Diagnosis not present

## 2020-08-31 DIAGNOSIS — L821 Other seborrheic keratosis: Secondary | ICD-10-CM | POA: Diagnosis not present

## 2020-08-31 DIAGNOSIS — Z1283 Encounter for screening for malignant neoplasm of skin: Secondary | ICD-10-CM | POA: Diagnosis not present

## 2020-11-08 DIAGNOSIS — R7301 Impaired fasting glucose: Secondary | ICD-10-CM | POA: Diagnosis not present

## 2020-11-08 DIAGNOSIS — M2560 Stiffness of unspecified joint, not elsewhere classified: Secondary | ICD-10-CM | POA: Diagnosis not present

## 2020-11-08 DIAGNOSIS — E559 Vitamin D deficiency, unspecified: Secondary | ICD-10-CM | POA: Diagnosis not present

## 2020-11-08 DIAGNOSIS — R109 Unspecified abdominal pain: Secondary | ICD-10-CM | POA: Diagnosis not present

## 2020-11-08 DIAGNOSIS — E782 Mixed hyperlipidemia: Secondary | ICD-10-CM | POA: Diagnosis not present

## 2020-11-08 DIAGNOSIS — Z0001 Encounter for general adult medical examination with abnormal findings: Secondary | ICD-10-CM | POA: Diagnosis not present

## 2020-11-08 DIAGNOSIS — R945 Abnormal results of liver function studies: Secondary | ICD-10-CM | POA: Diagnosis not present

## 2020-11-15 DIAGNOSIS — E559 Vitamin D deficiency, unspecified: Secondary | ICD-10-CM | POA: Diagnosis not present

## 2020-11-15 DIAGNOSIS — I10 Essential (primary) hypertension: Secondary | ICD-10-CM | POA: Diagnosis not present

## 2020-11-15 DIAGNOSIS — R7301 Impaired fasting glucose: Secondary | ICD-10-CM | POA: Diagnosis not present

## 2020-11-15 DIAGNOSIS — E782 Mixed hyperlipidemia: Secondary | ICD-10-CM | POA: Diagnosis not present

## 2021-01-02 IMAGING — US US ABDOMEN COMPLETE
1 series · 14 of 25 positions shown · non-contrast
Comparison: Right upper quadrant ultrasound, 11/24/2018

CLINICAL DATA: Abdominal pain, family history of abdominal aortic
aneurysm, history of prostatectomy

EXAM:
ABDOMEN ULTRASOUND COMPLETE

[Series 1: us abdomen complete · 0.22mm/px · 14 of 105 slices shown]
[im 1/105]
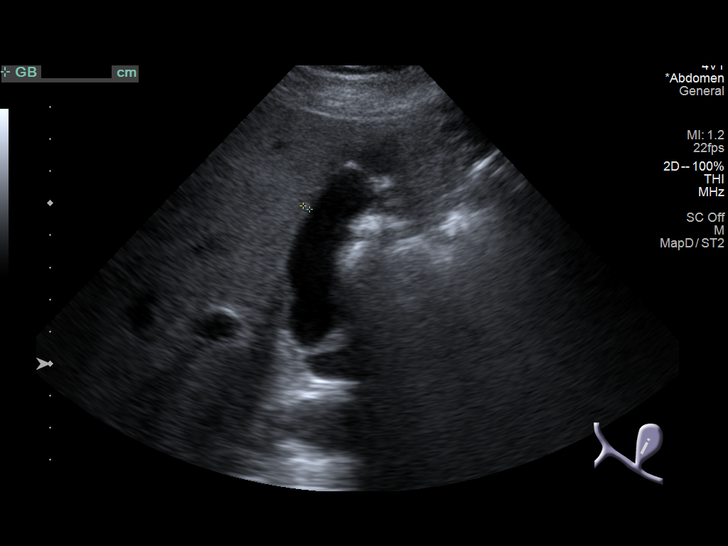
[im 9/105]
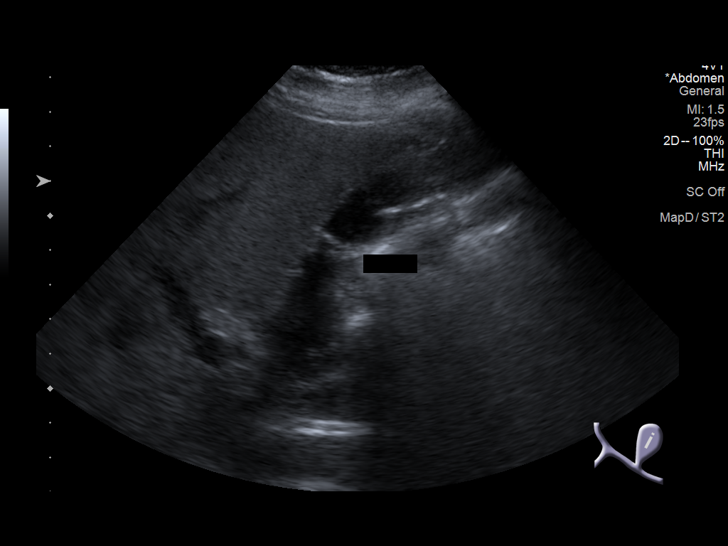
[im 18/105]
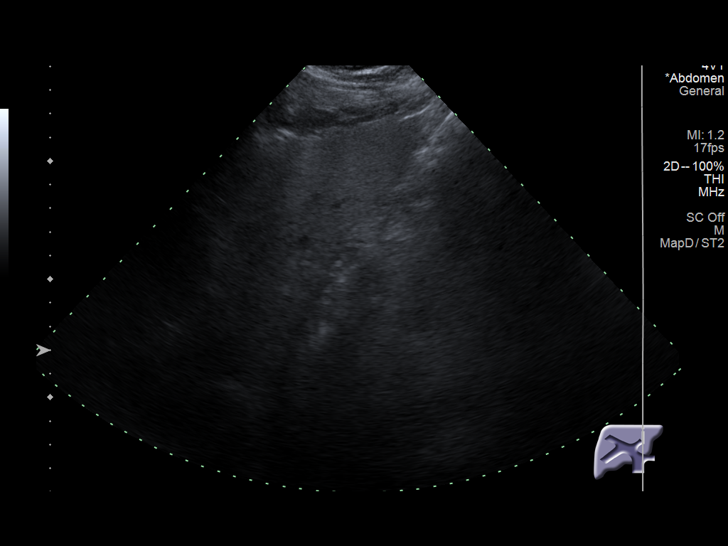
[im 27/105]
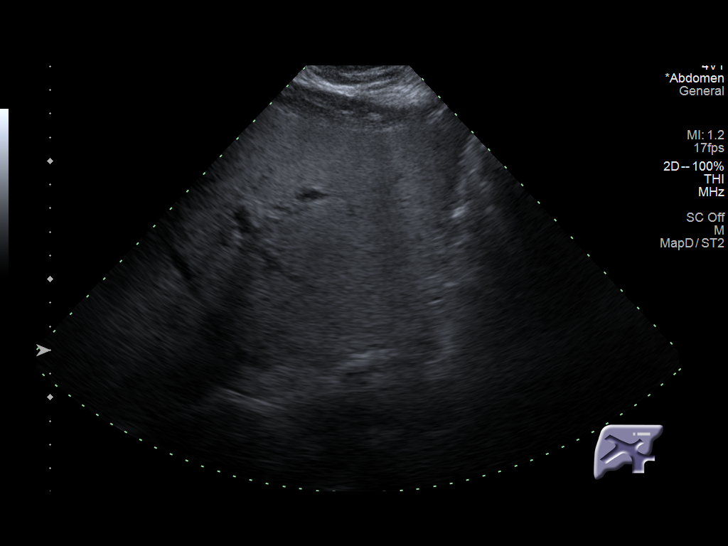
[im 35/105]
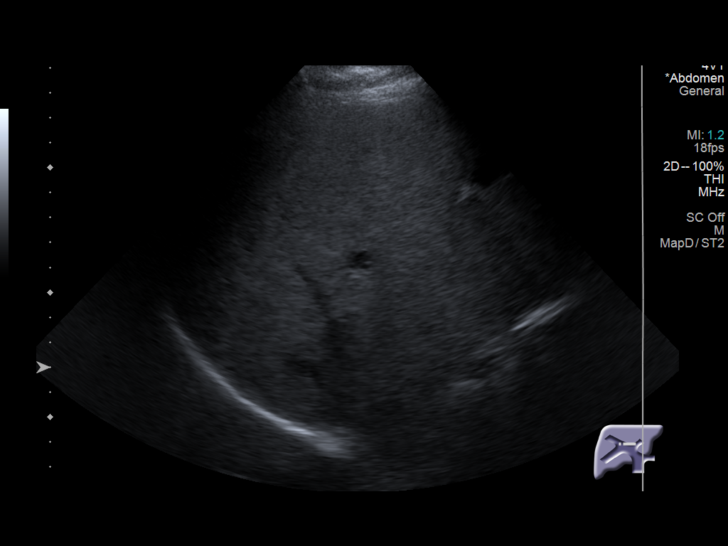
[im 40/105]
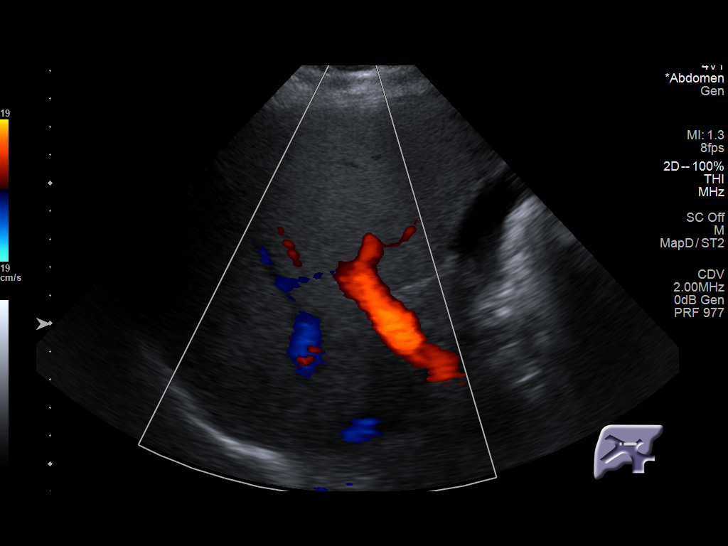
[im 48/105]
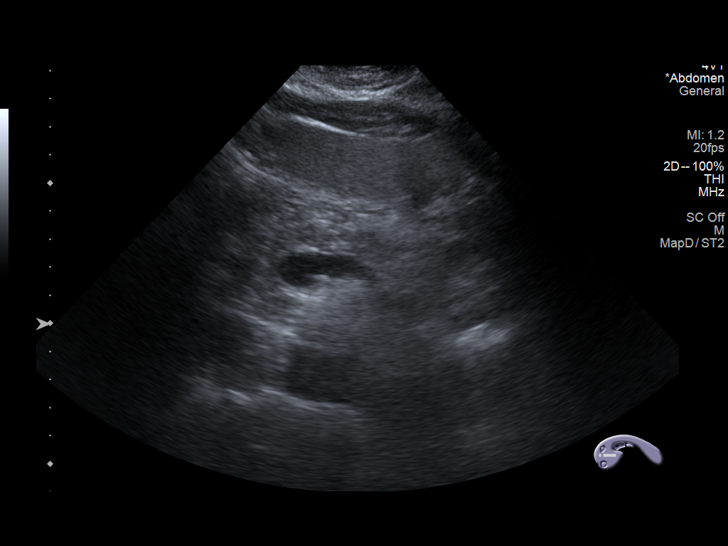
[im 57/105]
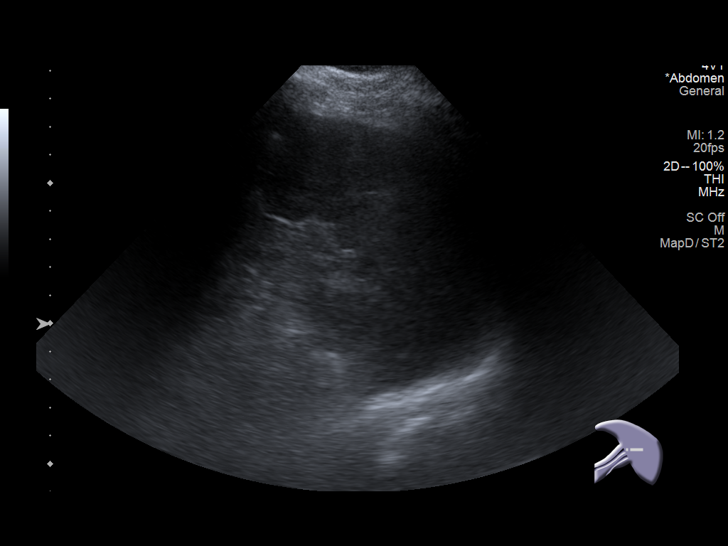
[im 66/105]
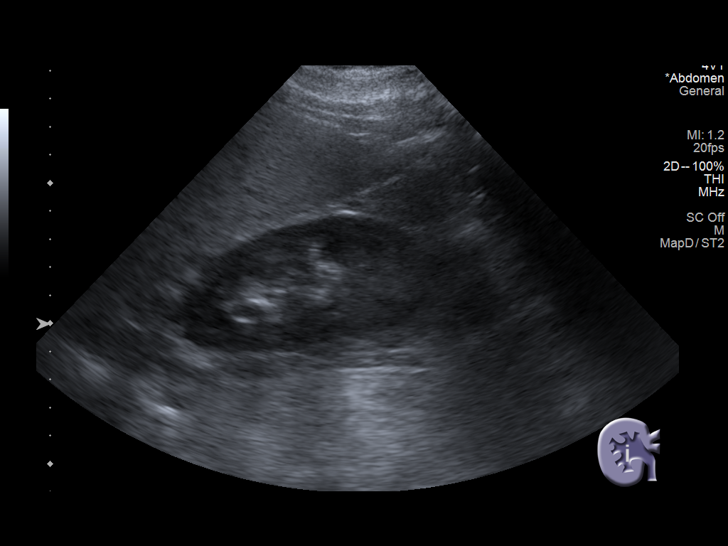
[im 70/105]
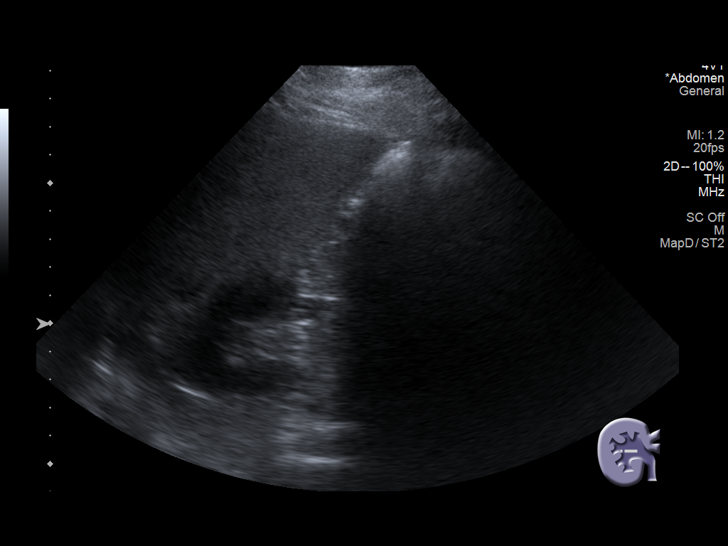
[im 79/105]
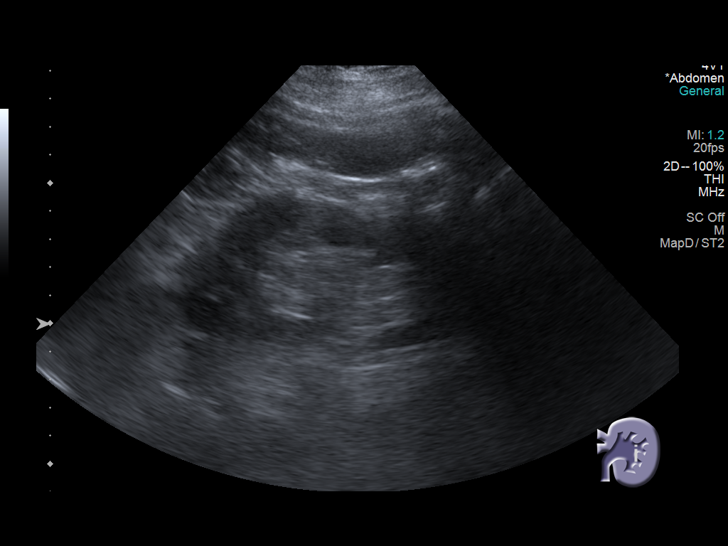
[im 87/105]
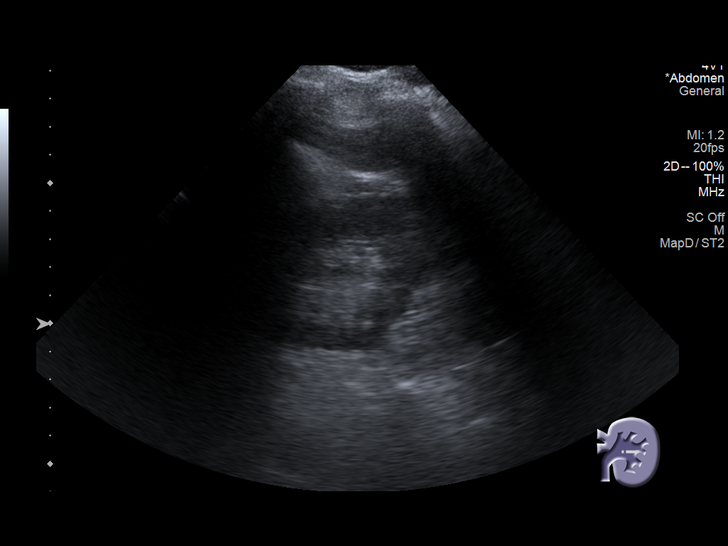
[im 96/105]
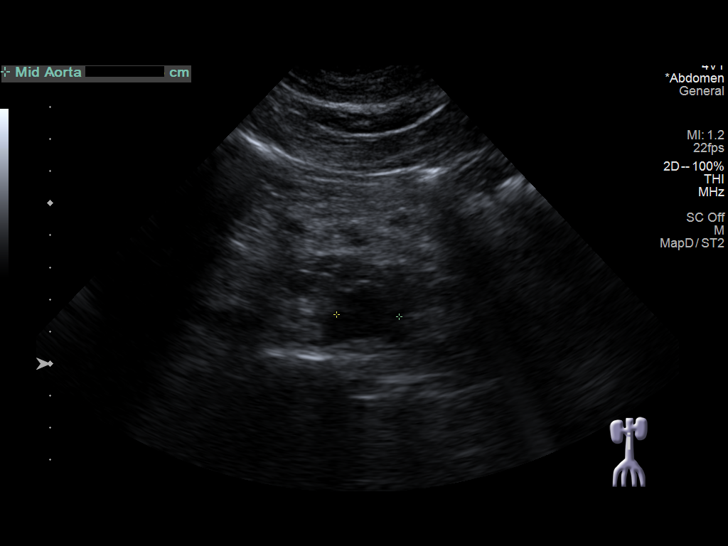
[im 105/105]
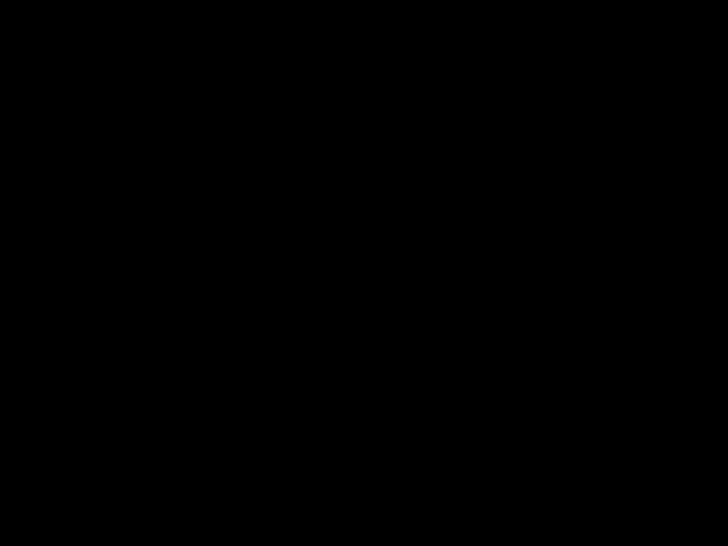

[14 of 25 positions shown; findings below may reference images not displayed]

FINDINGS: Gallbladder: No gallstones or wall thickening visualized. No
sonographic Murphy sign noted by sonographer.

Common bile duct: Diameter: 4 mm

Liver: No focal lesion identified. Increased parenchymal
echogenicity. Portal vein is patent on color Doppler imaging with
normal direction of blood flow towards the liver.

IVC: No abnormality visualized.

Pancreas: Visualized portion unremarkable.

Spleen: Size and appearance within normal limits.

Right Kidney: Length: 12.5 cm. Echogenicity within normal limits. No
mass or hydronephrosis visualized.

Left Kidney: Length: 12.5 cm. Echogenicity within normal limits. No
mass or hydronephrosis visualized.

Abdominal aorta: No aneurysm visualized.

Other findings: None.
IMPRESSION: 1. No acute ultrasound findings of the abdomen to explain abdominal
pain. Consider CT or MRI to further evaluate otherwise unexplained
abdominal pain.

2.  Hepatic steatosis.

## 2021-03-05 DIAGNOSIS — R55 Syncope and collapse: Secondary | ICD-10-CM | POA: Diagnosis not present

## 2021-03-05 DIAGNOSIS — R7301 Impaired fasting glucose: Secondary | ICD-10-CM | POA: Diagnosis not present

## 2021-03-26 DIAGNOSIS — H43813 Vitreous degeneration, bilateral: Secondary | ICD-10-CM | POA: Diagnosis not present

## 2021-03-30 DIAGNOSIS — Z23 Encounter for immunization: Secondary | ICD-10-CM | POA: Diagnosis not present

## 2021-04-05 DIAGNOSIS — L918 Other hypertrophic disorders of the skin: Secondary | ICD-10-CM | POA: Diagnosis not present

## 2021-04-05 DIAGNOSIS — L255 Unspecified contact dermatitis due to plants, except food: Secondary | ICD-10-CM | POA: Diagnosis not present

## 2021-05-09 DIAGNOSIS — E782 Mixed hyperlipidemia: Secondary | ICD-10-CM | POA: Diagnosis not present

## 2021-05-09 DIAGNOSIS — I10 Essential (primary) hypertension: Secondary | ICD-10-CM | POA: Diagnosis not present

## 2021-05-09 DIAGNOSIS — R7301 Impaired fasting glucose: Secondary | ICD-10-CM | POA: Diagnosis not present

## 2021-05-09 DIAGNOSIS — E559 Vitamin D deficiency, unspecified: Secondary | ICD-10-CM | POA: Diagnosis not present

## 2021-05-15 DIAGNOSIS — E782 Mixed hyperlipidemia: Secondary | ICD-10-CM | POA: Diagnosis not present

## 2021-05-15 DIAGNOSIS — M792 Neuralgia and neuritis, unspecified: Secondary | ICD-10-CM | POA: Diagnosis not present

## 2021-05-15 DIAGNOSIS — I10 Essential (primary) hypertension: Secondary | ICD-10-CM | POA: Diagnosis not present

## 2021-05-15 DIAGNOSIS — K760 Fatty (change of) liver, not elsewhere classified: Secondary | ICD-10-CM | POA: Diagnosis not present

## 2021-05-15 DIAGNOSIS — E559 Vitamin D deficiency, unspecified: Secondary | ICD-10-CM | POA: Diagnosis not present

## 2021-05-15 DIAGNOSIS — R7301 Impaired fasting glucose: Secondary | ICD-10-CM | POA: Diagnosis not present

## 2021-07-10 DIAGNOSIS — T1490XA Injury, unspecified, initial encounter: Secondary | ICD-10-CM | POA: Diagnosis not present

## 2021-07-10 DIAGNOSIS — M79675 Pain in left toe(s): Secondary | ICD-10-CM | POA: Diagnosis not present

## 2021-07-23 DIAGNOSIS — T1490XD Injury, unspecified, subsequent encounter: Secondary | ICD-10-CM | POA: Diagnosis not present

## 2021-07-23 DIAGNOSIS — Z0001 Encounter for general adult medical examination with abnormal findings: Secondary | ICD-10-CM | POA: Diagnosis not present

## 2021-07-23 DIAGNOSIS — M79675 Pain in left toe(s): Secondary | ICD-10-CM | POA: Diagnosis not present

## 2021-09-24 DIAGNOSIS — Z1283 Encounter for screening for malignant neoplasm of skin: Secondary | ICD-10-CM | POA: Diagnosis not present

## 2021-09-24 DIAGNOSIS — D485 Neoplasm of uncertain behavior of skin: Secondary | ICD-10-CM | POA: Diagnosis not present

## 2021-09-24 DIAGNOSIS — D225 Melanocytic nevi of trunk: Secondary | ICD-10-CM | POA: Diagnosis not present

## 2021-09-24 DIAGNOSIS — D2272 Melanocytic nevi of left lower limb, including hip: Secondary | ICD-10-CM | POA: Diagnosis not present

## 2021-11-19 DIAGNOSIS — E782 Mixed hyperlipidemia: Secondary | ICD-10-CM | POA: Diagnosis not present

## 2021-11-19 DIAGNOSIS — R7301 Impaired fasting glucose: Secondary | ICD-10-CM | POA: Diagnosis not present

## 2021-11-21 DIAGNOSIS — R7301 Impaired fasting glucose: Secondary | ICD-10-CM | POA: Diagnosis not present

## 2021-11-21 DIAGNOSIS — I10 Essential (primary) hypertension: Secondary | ICD-10-CM | POA: Diagnosis not present

## 2021-11-21 DIAGNOSIS — E782 Mixed hyperlipidemia: Secondary | ICD-10-CM | POA: Diagnosis not present

## 2021-11-21 DIAGNOSIS — E559 Vitamin D deficiency, unspecified: Secondary | ICD-10-CM | POA: Diagnosis not present

## 2021-11-21 DIAGNOSIS — K760 Fatty (change of) liver, not elsewhere classified: Secondary | ICD-10-CM | POA: Diagnosis not present

## 2021-11-23 DIAGNOSIS — Z23 Encounter for immunization: Secondary | ICD-10-CM | POA: Diagnosis not present

## 2022-01-10 DIAGNOSIS — H9202 Otalgia, left ear: Secondary | ICD-10-CM | POA: Diagnosis not present

## 2022-03-14 DIAGNOSIS — B078 Other viral warts: Secondary | ICD-10-CM | POA: Diagnosis not present

## 2022-03-14 DIAGNOSIS — Z1283 Encounter for screening for malignant neoplasm of skin: Secondary | ICD-10-CM | POA: Diagnosis not present

## 2022-03-14 DIAGNOSIS — L820 Inflamed seborrheic keratosis: Secondary | ICD-10-CM | POA: Diagnosis not present

## 2022-03-14 DIAGNOSIS — D225 Melanocytic nevi of trunk: Secondary | ICD-10-CM | POA: Diagnosis not present

## 2022-03-28 DIAGNOSIS — H43393 Other vitreous opacities, bilateral: Secondary | ICD-10-CM | POA: Diagnosis not present

## 2022-05-17 DIAGNOSIS — R7301 Impaired fasting glucose: Secondary | ICD-10-CM | POA: Diagnosis not present

## 2022-05-17 DIAGNOSIS — E782 Mixed hyperlipidemia: Secondary | ICD-10-CM | POA: Diagnosis not present

## 2022-05-17 DIAGNOSIS — Z23 Encounter for immunization: Secondary | ICD-10-CM | POA: Diagnosis not present

## 2022-05-22 DIAGNOSIS — K760 Fatty (change of) liver, not elsewhere classified: Secondary | ICD-10-CM | POA: Diagnosis not present

## 2022-05-22 DIAGNOSIS — R7301 Impaired fasting glucose: Secondary | ICD-10-CM | POA: Diagnosis not present

## 2022-05-22 DIAGNOSIS — E875 Hyperkalemia: Secondary | ICD-10-CM | POA: Diagnosis not present

## 2022-05-22 DIAGNOSIS — I10 Essential (primary) hypertension: Secondary | ICD-10-CM | POA: Diagnosis not present

## 2022-05-22 DIAGNOSIS — W57XXXA Bitten or stung by nonvenomous insect and other nonvenomous arthropods, initial encounter: Secondary | ICD-10-CM | POA: Diagnosis not present

## 2022-05-22 DIAGNOSIS — E559 Vitamin D deficiency, unspecified: Secondary | ICD-10-CM | POA: Diagnosis not present

## 2022-05-22 DIAGNOSIS — E782 Mixed hyperlipidemia: Secondary | ICD-10-CM | POA: Diagnosis not present

## 2022-09-26 DIAGNOSIS — R0781 Pleurodynia: Secondary | ICD-10-CM | POA: Diagnosis not present

## 2022-11-21 DIAGNOSIS — E782 Mixed hyperlipidemia: Secondary | ICD-10-CM | POA: Diagnosis not present

## 2022-11-21 DIAGNOSIS — R7301 Impaired fasting glucose: Secondary | ICD-10-CM | POA: Diagnosis not present

## 2022-11-25 DIAGNOSIS — R7301 Impaired fasting glucose: Secondary | ICD-10-CM | POA: Diagnosis not present

## 2022-11-25 DIAGNOSIS — E782 Mixed hyperlipidemia: Secondary | ICD-10-CM | POA: Diagnosis not present

## 2022-11-25 DIAGNOSIS — E559 Vitamin D deficiency, unspecified: Secondary | ICD-10-CM | POA: Diagnosis not present

## 2022-11-25 DIAGNOSIS — I10 Essential (primary) hypertension: Secondary | ICD-10-CM | POA: Diagnosis not present

## 2022-11-25 DIAGNOSIS — Z0000 Encounter for general adult medical examination without abnormal findings: Secondary | ICD-10-CM | POA: Diagnosis not present

## 2022-11-25 DIAGNOSIS — R944 Abnormal results of kidney function studies: Secondary | ICD-10-CM | POA: Diagnosis not present

## 2022-11-25 DIAGNOSIS — K760 Fatty (change of) liver, not elsewhere classified: Secondary | ICD-10-CM | POA: Diagnosis not present

## 2022-11-25 DIAGNOSIS — E875 Hyperkalemia: Secondary | ICD-10-CM | POA: Diagnosis not present

## 2022-12-09 DIAGNOSIS — R944 Abnormal results of kidney function studies: Secondary | ICD-10-CM | POA: Diagnosis not present

## 2023-03-08 DIAGNOSIS — M79671 Pain in right foot: Secondary | ICD-10-CM | POA: Diagnosis not present

## 2023-03-08 DIAGNOSIS — I10 Essential (primary) hypertension: Secondary | ICD-10-CM | POA: Diagnosis not present

## 2023-03-08 DIAGNOSIS — M25571 Pain in right ankle and joints of right foot: Secondary | ICD-10-CM | POA: Diagnosis not present

## 2023-03-08 DIAGNOSIS — Z7982 Long term (current) use of aspirin: Secondary | ICD-10-CM | POA: Diagnosis not present

## 2023-03-08 DIAGNOSIS — J45909 Unspecified asthma, uncomplicated: Secondary | ICD-10-CM | POA: Diagnosis not present

## 2023-03-19 DIAGNOSIS — S86011D Strain of right Achilles tendon, subsequent encounter: Secondary | ICD-10-CM | POA: Diagnosis not present

## 2023-05-21 DIAGNOSIS — R7301 Impaired fasting glucose: Secondary | ICD-10-CM | POA: Diagnosis not present

## 2023-05-21 DIAGNOSIS — E559 Vitamin D deficiency, unspecified: Secondary | ICD-10-CM | POA: Diagnosis not present

## 2023-05-21 DIAGNOSIS — E782 Mixed hyperlipidemia: Secondary | ICD-10-CM | POA: Diagnosis not present

## 2023-05-28 DIAGNOSIS — Z0000 Encounter for general adult medical examination without abnormal findings: Secondary | ICD-10-CM | POA: Diagnosis not present

## 2023-05-29 DIAGNOSIS — R944 Abnormal results of kidney function studies: Secondary | ICD-10-CM | POA: Diagnosis not present

## 2023-05-29 DIAGNOSIS — E782 Mixed hyperlipidemia: Secondary | ICD-10-CM | POA: Diagnosis not present

## 2023-05-29 DIAGNOSIS — E559 Vitamin D deficiency, unspecified: Secondary | ICD-10-CM | POA: Diagnosis not present

## 2023-05-29 DIAGNOSIS — K760 Fatty (change of) liver, not elsewhere classified: Secondary | ICD-10-CM | POA: Diagnosis not present

## 2023-05-29 DIAGNOSIS — Z634 Disappearance and death of family member: Secondary | ICD-10-CM | POA: Diagnosis not present

## 2023-05-29 DIAGNOSIS — E875 Hyperkalemia: Secondary | ICD-10-CM | POA: Diagnosis not present

## 2023-05-29 DIAGNOSIS — R7301 Impaired fasting glucose: Secondary | ICD-10-CM | POA: Diagnosis not present

## 2023-05-29 DIAGNOSIS — I10 Essential (primary) hypertension: Secondary | ICD-10-CM | POA: Diagnosis not present

## 2023-06-25 DIAGNOSIS — H04123 Dry eye syndrome of bilateral lacrimal glands: Secondary | ICD-10-CM | POA: Diagnosis not present

## 2023-09-30 DIAGNOSIS — H1030 Unspecified acute conjunctivitis, unspecified eye: Secondary | ICD-10-CM | POA: Diagnosis not present

## 2023-09-30 DIAGNOSIS — H1032 Unspecified acute conjunctivitis, left eye: Secondary | ICD-10-CM | POA: Diagnosis not present

## 2023-12-02 DIAGNOSIS — E782 Mixed hyperlipidemia: Secondary | ICD-10-CM | POA: Diagnosis not present

## 2023-12-02 DIAGNOSIS — R7301 Impaired fasting glucose: Secondary | ICD-10-CM | POA: Diagnosis not present

## 2023-12-02 DIAGNOSIS — E559 Vitamin D deficiency, unspecified: Secondary | ICD-10-CM | POA: Diagnosis not present

## 2023-12-08 DIAGNOSIS — Z634 Disappearance and death of family member: Secondary | ICD-10-CM | POA: Diagnosis not present

## 2023-12-08 DIAGNOSIS — Z0000 Encounter for general adult medical examination without abnormal findings: Secondary | ICD-10-CM | POA: Diagnosis not present

## 2023-12-08 DIAGNOSIS — E875 Hyperkalemia: Secondary | ICD-10-CM | POA: Diagnosis not present

## 2023-12-08 DIAGNOSIS — E782 Mixed hyperlipidemia: Secondary | ICD-10-CM | POA: Diagnosis not present

## 2023-12-08 DIAGNOSIS — R944 Abnormal results of kidney function studies: Secondary | ICD-10-CM | POA: Diagnosis not present

## 2023-12-08 DIAGNOSIS — E559 Vitamin D deficiency, unspecified: Secondary | ICD-10-CM | POA: Diagnosis not present

## 2023-12-08 DIAGNOSIS — K760 Fatty (change of) liver, not elsewhere classified: Secondary | ICD-10-CM | POA: Diagnosis not present

## 2023-12-08 DIAGNOSIS — I10 Essential (primary) hypertension: Secondary | ICD-10-CM | POA: Diagnosis not present

## 2023-12-08 DIAGNOSIS — R7301 Impaired fasting glucose: Secondary | ICD-10-CM | POA: Diagnosis not present

## 2024-03-17 ENCOUNTER — Ambulatory Visit: Admitting: Podiatry

## 2024-03-17 ENCOUNTER — Encounter: Payer: Self-pay | Admitting: Podiatry

## 2024-03-17 DIAGNOSIS — L97512 Non-pressure chronic ulcer of other part of right foot with fat layer exposed: Secondary | ICD-10-CM

## 2024-03-17 NOTE — Progress Notes (Signed)
   Chief Complaint  Patient presents with   Foot Ulcer    rm8: NP/ ulcer 1st hallux right foot, non diabetic, red, swollen warm touch and painful pt states it started as a blister and from wearing shoes and rubbing it became irritated and the blister erupted. He has tried applying neosporin and bandages.    HPI: 78 y.o. male presenting today as a new patient for evaluation of a blister that developed to the medial aspect of the right great toe when he spent a significant amount of time walking around the streets of [place], [place] and dress shoes.  He says he developed a blister and the blister erupted and a small wound developed.  Since that time he has not really paid attention to the area but he continues to have a small superficial wound that he would like to have evaluated  Past Medical History:  Diagnosis Date   Cancer [facility])    prostate cancer   Hypercholesteremia    Hypertension     Past Surgical History:  Procedure Laterality Date   COLONOSCOPY N/A 08/25/2018   Procedure: COLONOSCOPY;  Surgeon: Corbin Ade, MD;  Location: [facility];  Service: Endoscopy;  Laterality: N/A;  10:30   PROSTATECTOMY     Right should surgery      No Known Allergies   Physical Exam: General: The patient is alert and oriented x3 in no acute distress.  Dermatology: Skin is warm, dry and supple bilateral lower extremities.  Small superficial wound noted to the medial aspect of the first MTP of the right foot secondary to pressure from ill fitting dress shoes  Vascular: Palpable pedal pulses bilaterally. Capillary refill within normal limits.  No appreciable edema.  No erythema.  Neurological: Grossly intact via light touch  Musculoskeletal Exam: There is some prominence noted to the first metatarsal head of the right foot with the overlying callus and superficial wound.  Likely contributing to pressure from ill fitting dress shoes.  Assessment/Plan of Care: 1.  Superficial stable ulcer medial  aspect of the first MTP of the right foot  -Patient evaluated. -Medically necessary excisional debridement including subcutaneous tissue was performed today using a tissue nipper.  Excisional debridement of the necrotic nonviable tissue down to healthier bleeding viable tissue was performed with postoperative measurement same as pre- -Recommend Silvadene cream and a light dressing daily.  Silvadene cream provided -I do believe that the wound should heal uneventfully if he is adequately able to alleviate pressure from the area.  Recommend wide fitting shoes and dressings daily until the lesion resolves. -Return to clinic as needed       Felecia Shelling, DPM [facility]  Dr. Felecia Shelling, DPM    [address] [place], [place] [address]                Office [phone]  Fax [phone]

## 2024-05-28 DIAGNOSIS — R58 Hemorrhage, not elsewhere classified: Secondary | ICD-10-CM | POA: Diagnosis not present

## 2024-06-09 DIAGNOSIS — D225 Melanocytic nevi of trunk: Secondary | ICD-10-CM | POA: Diagnosis not present

## 2024-06-09 DIAGNOSIS — W57XXXA Bitten or stung by nonvenomous insect and other nonvenomous arthropods, initial encounter: Secondary | ICD-10-CM | POA: Diagnosis not present

## 2024-06-09 DIAGNOSIS — Z1283 Encounter for screening for malignant neoplasm of skin: Secondary | ICD-10-CM | POA: Diagnosis not present

## 2024-06-15 DIAGNOSIS — E559 Vitamin D deficiency, unspecified: Secondary | ICD-10-CM | POA: Diagnosis not present

## 2024-06-15 DIAGNOSIS — R7301 Impaired fasting glucose: Secondary | ICD-10-CM | POA: Diagnosis not present

## 2024-06-15 DIAGNOSIS — E782 Mixed hyperlipidemia: Secondary | ICD-10-CM | POA: Diagnosis not present

## 2024-06-22 DIAGNOSIS — E782 Mixed hyperlipidemia: Secondary | ICD-10-CM | POA: Diagnosis not present

## 2024-06-22 DIAGNOSIS — K760 Fatty (change of) liver, not elsewhere classified: Secondary | ICD-10-CM | POA: Diagnosis not present

## 2024-06-22 DIAGNOSIS — I10 Essential (primary) hypertension: Secondary | ICD-10-CM | POA: Diagnosis not present

## 2024-06-22 DIAGNOSIS — R7301 Impaired fasting glucose: Secondary | ICD-10-CM | POA: Diagnosis not present

## 2024-06-22 DIAGNOSIS — E875 Hyperkalemia: Secondary | ICD-10-CM | POA: Diagnosis not present

## 2024-06-22 DIAGNOSIS — R944 Abnormal results of kidney function studies: Secondary | ICD-10-CM | POA: Diagnosis not present

## 2024-06-22 DIAGNOSIS — M7660 Achilles tendinitis, unspecified leg: Secondary | ICD-10-CM | POA: Diagnosis not present

## 2024-06-22 DIAGNOSIS — Z634 Disappearance and death of family member: Secondary | ICD-10-CM | POA: Diagnosis not present

## 2024-06-22 DIAGNOSIS — E559 Vitamin D deficiency, unspecified: Secondary | ICD-10-CM | POA: Diagnosis not present
